# Patient Record
Sex: Female | Born: 1975 | Marital: Single | Smoking: Never smoker
Health system: Southern US, Community
[De-identification: ages and names within clinical notes are randomized; demographics above are authoritative.]

## PROBLEM LIST (undated history)

## (undated) DIAGNOSIS — E041 Nontoxic single thyroid nodule: Secondary | ICD-10-CM

## (undated) DIAGNOSIS — Z9889 Other specified postprocedural states: Secondary | ICD-10-CM

## (undated) DIAGNOSIS — F32A Depression, unspecified: Secondary | ICD-10-CM

## (undated) DIAGNOSIS — D649 Anemia, unspecified: Secondary | ICD-10-CM

## (undated) HISTORY — DX: Depression, unspecified: F32.A

## (undated) HISTORY — DX: Anemia, unspecified: D64.9

## (undated) HISTORY — DX: Other specified postprocedural states: Z98.890

## (undated) HISTORY — DX: Nontoxic single thyroid nodule: E04.1

---

## 2019-01-25 HISTORY — PX: COLONOSCOPY: SHX174

## 2020-10-01 ENCOUNTER — Encounter: Payer: Self-pay | Admitting: Hematology and Oncology

## 2020-10-20 ENCOUNTER — Other Ambulatory Visit: Payer: Self-pay

## 2020-10-20 ENCOUNTER — Encounter: Payer: Self-pay | Admitting: Oncology

## 2020-11-09 ENCOUNTER — Other Ambulatory Visit: Payer: Self-pay

## 2020-11-09 ENCOUNTER — Encounter: Payer: Self-pay | Admitting: Oncology

## 2020-11-09 ENCOUNTER — Encounter (INDEPENDENT_AMBULATORY_CARE_PROVIDER_SITE_OTHER): Payer: Self-pay

## 2020-11-09 ENCOUNTER — Inpatient Hospital Stay: Payer: Federal, State, Local not specified - PPO

## 2020-11-09 ENCOUNTER — Inpatient Hospital Stay: Payer: Federal, State, Local not specified - PPO | Attending: Oncology | Admitting: Oncology

## 2020-11-09 VITALS — BP 131/86 | HR 74 | Temp 99.6°F | Wt 190.0 lb

## 2020-11-09 DIAGNOSIS — N924 Excessive bleeding in the premenopausal period: Secondary | ICD-10-CM | POA: Diagnosis not present

## 2020-11-09 DIAGNOSIS — D649 Anemia, unspecified: Secondary | ICD-10-CM | POA: Diagnosis not present

## 2020-11-09 DIAGNOSIS — E042 Nontoxic multinodular goiter: Secondary | ICD-10-CM | POA: Insufficient documentation

## 2020-11-09 DIAGNOSIS — Z862 Personal history of diseases of the blood and blood-forming organs and certain disorders involving the immune mechanism: Secondary | ICD-10-CM | POA: Insufficient documentation

## 2020-11-09 DIAGNOSIS — R0602 Shortness of breath: Secondary | ICD-10-CM | POA: Insufficient documentation

## 2020-11-09 DIAGNOSIS — D72819 Decreased white blood cell count, unspecified: Secondary | ICD-10-CM | POA: Diagnosis not present

## 2020-11-09 DIAGNOSIS — Z8616 Personal history of COVID-19: Secondary | ICD-10-CM | POA: Diagnosis not present

## 2020-11-09 LAB — FERRITIN: Ferritin: 383 ng/mL — ABNORMAL HIGH (ref 11–307)

## 2020-11-09 LAB — CBC WITH DIFFERENTIAL/PLATELET
Abs Immature Granulocytes: 0 10*3/uL (ref 0.00–0.07)
Basophils Absolute: 0 10*3/uL (ref 0.0–0.1)
Basophils Relative: 1 %
Eosinophils Absolute: 0 10*3/uL (ref 0.0–0.5)
Eosinophils Relative: 1 %
HCT: 41.4 % (ref 36.0–46.0)
Hemoglobin: 14 g/dL (ref 12.0–15.0)
Immature Granulocytes: 0 %
Lymphocytes Relative: 34 %
Lymphs Abs: 1.1 10*3/uL (ref 0.7–4.0)
MCH: 33.3 pg (ref 26.0–34.0)
MCHC: 33.8 g/dL (ref 30.0–36.0)
MCV: 98.6 fL (ref 80.0–100.0)
Monocytes Absolute: 0.3 10*3/uL (ref 0.1–1.0)
Monocytes Relative: 10 %
Neutro Abs: 1.7 10*3/uL (ref 1.7–7.7)
Neutrophils Relative %: 54 %
Platelets: 271 10*3/uL (ref 150–400)
RBC: 4.2 MIL/uL (ref 3.87–5.11)
RDW: 14.1 % (ref 11.5–15.5)
WBC: 3.2 10*3/uL — ABNORMAL LOW (ref 4.0–10.5)
nRBC: 0 % (ref 0.0–0.2)

## 2020-11-09 LAB — RETIC PANEL
Immature Retic Fract: 11.2 % (ref 2.3–15.9)
RBC.: 4.13 MIL/uL (ref 3.87–5.11)
Retic Count, Absolute: 69.8 10*3/uL (ref 19.0–186.0)
Retic Ct Pct: 1.7 % (ref 0.4–3.1)
Reticulocyte Hemoglobin: 35.9 pg (ref >27.9)

## 2020-11-09 LAB — COMPREHENSIVE METABOLIC PANEL
ALT: 15 U/L (ref 0–44)
AST: 18 U/L (ref 15–41)
Albumin: 4.8 g/dL (ref 3.5–5.0)
Alkaline Phosphatase: 70 U/L (ref 38–126)
Anion gap: 7 (ref 5–15)
BUN: 12 mg/dL (ref 6–20)
CO2: 25 mmol/L (ref 22–32)
Calcium: 9.7 mg/dL (ref 8.9–10.3)
Chloride: 103 mmol/L (ref 98–111)
Creatinine, Ser: 0.57 mg/dL (ref 0.44–1.00)
GFR, Estimated: >60 mL/min (ref >60)
Glucose, Bld: 93 mg/dL (ref 70–99)
Potassium: 3.9 mmol/L (ref 3.5–5.1)
Sodium: 135 mmol/L (ref 135–145)
Total Bilirubin: 0.2 mg/dL — ABNORMAL LOW (ref 0.3–1.2)
Total Protein: 8.8 g/dL — ABNORMAL HIGH (ref 6.5–8.1)

## 2020-11-09 LAB — FOLATE: Folate: 34 ng/mL (ref >5.9)

## 2020-11-09 LAB — IRON AND TIBC
Iron: 111 ug/dL (ref 28–170)
Saturation Ratios: 37 % — ABNORMAL HIGH (ref 10.4–31.8)
TIBC: 298 ug/dL (ref 250–450)
UIBC: 187 ug/dL

## 2020-11-09 LAB — TECHNOLOGIST SMEAR REVIEW: Plt Morphology: NORMAL

## 2020-11-09 LAB — PREGNANCY, URINE: Preg Test, Ur: NEGATIVE

## 2020-11-09 LAB — VITAMIN B12: Vitamin B-12: 637 pg/mL (ref 180–914)

## 2020-11-09 LAB — LACTATE DEHYDROGENASE: LDH: 124 U/L (ref 98–192)

## 2020-11-09 NOTE — Progress Notes (Signed)
Hematology/Oncology Consult note [facility] [phone] [phone]   Patient Care Team: Pcp, No as PCP - General  REFERRING PROVIDER: No ref. provider found  CHIEF COMPLAINTS/REASON FOR VISIT:  Evaluation of anemia  HISTORY OF PRESENTING ILLNESS:   Cynthia Barajas is a  45 y.o.  female with PMH listed below was seen in consultation at the request of  No ref. provider found  for evaluation of anemia  Patient moved to [place] recently from [place]. She reports a history of iron deficiency anemia.  She previously has received IV iron treatments.  She has relocated multiple times due to her work nature. Per patient, she started her IV Injectafer back in 2018, in [place]. She reports a history of allergic reactions including difficult breathing and skin itching after receiving INFeD in 2019. Previous hematology note by [facility] Dr. Rennie Natter on 09/09/2020 was scanned to EMR.  Records were reviewed.  Patient received IV 25 mg of Benadryl prior to injectafer.  She has had nausea vomiting for 1 week after the treatments. Patient reports that it was her previous hematologist protocol that everybody receives management and she has asked to receive a lower dose of Benadryl. Iron deficiency was felt to be secondary to menorrhagia.  She follows up with the [place] [facility] and prefers to continue following up there. Patient reports that she had EGD/colonoscopy done in 2021.   She also reports having COVID19 Last year and continues to have chronic shortness of breath with exertion.  She is interested in finding a local pulmonologist for evaluation.  She has not had a local primary care provider yet.  Thyroid nodule, 09/04/2020, thyroid ultrasound showed stable thyroid nodules since 03/11/2020.  Was recommended to follow-up in 1, 3, 5 years.    Review of Systems  Constitutional:  Positive for fatigue. Negative for appetite change,  chills, fever and unexpected weight change.  HENT:   Negative for hearing loss and voice change.   Eyes:  Negative for eye problems.  Respiratory:  Negative for chest tightness and cough.   Cardiovascular:  Negative for chest pain.  Gastrointestinal:  Negative for abdominal distention, abdominal pain and blood in stool.  Endocrine: Negative for hot flashes.  Genitourinary:  Positive for menstrual problem. Negative for difficulty urinating and frequency.   Musculoskeletal:  Negative for arthralgias.  Skin:  Negative for itching and rash.  Neurological:  Negative for extremity weakness.  Hematological:  Negative for adenopathy.  Psychiatric/Behavioral:  Negative for confusion.    MEDICAL HISTORY:  Past Medical History:  Diagnosis Date   Anemia    Depression    Hx of colonoscopy    Thyroid nodule     SURGICAL HISTORY: Past Surgical History:  Procedure Laterality Date   COLONOSCOPY  2021    SOCIAL HISTORY: Social History   Socioeconomic History   Marital status: Single    Spouse name: Not on file   Number of children: Not on file   Years of education: Not on file   Highest education level: Not on file  Occupational History   Not on file  Tobacco Use   Smoking status: Never   Smokeless tobacco: Never  Vaping Use   Vaping Use: Never used  Substance and Sexual Activity   Alcohol use: Never   Drug use: Never   Sexual activity: Not on file  Other Topics Concern   Not on file  Social History Narrative   Not on file   Social Determinants of  Health   Financial Resource Strain: Not on file  Food Insecurity: Not on file  Transportation Needs: Not on file  Physical Activity: Not on file  Stress: Not on file  Social Connections: Not on file  Intimate Partner Violence: Not on file    FAMILY HISTORY: Family History  Problem Relation Age of Onset   Hypertension Mother     ALLERGIES:  has No Known Allergies.  MEDICATIONS:  Current Outpatient Medications   Medication Sig Dispense Refill   valACYclovir (VALTREX) 500 MG tablet Take 500 mg by mouth daily. SMARTSIG:1 Tablet(s) By Mouth Daily     No current facility-administered medications for this visit.     PHYSICAL EXAMINATION: ECOG PERFORMANCE STATUS: 1 - Symptomatic but completely ambulatory Vitals:   11/09/20 0916  BP: 131/86  Pulse: 74  Temp: 99.6 F (37.6 C)  SpO2: 99%   Filed Weights   11/09/20 0916  Weight: 190 lb (86.2 kg)    Physical Exam Constitutional:      General: She is not in acute distress. HENT:     Head: Normocephalic and atraumatic.  Eyes:     General: No scleral icterus. Cardiovascular:     Rate and Rhythm: Normal rate and regular rhythm.     Heart sounds: Normal heart sounds.  Pulmonary:     Effort: Pulmonary effort is normal. No respiratory distress.     Breath sounds: No wheezing.  Abdominal:     General: Bowel sounds are normal. There is no distension.     Palpations: Abdomen is soft.  Musculoskeletal:        General: No deformity. Normal range of motion.     Cervical back: Normal range of motion and neck supple.  Skin:    General: Skin is warm and dry.     Findings: No erythema or rash.  Neurological:     Mental Status: She is alert and oriented to person, place, and time. Mental status is at baseline.     Cranial Nerves: No cranial nerve deficit.     Coordination: Coordination normal.  Psychiatric:        Mood and Affect: Mood normal.    LABORATORY DATA:  I have reviewed the data as listed Lab Results  Component Value Date   WBC 3.2 (L) 11/09/2020   HGB 14.0 11/09/2020   HCT 41.4 11/09/2020   MCV 98.6 11/09/2020   PLT 271 11/09/2020   Recent Labs    11/09/20 1013  NA 135  K 3.9  CL 103  CO2 25  GLUCOSE 93  BUN 12  CREATININE 0.57  CALCIUM 9.7  GFRNONAA >60  PROT 8.8*  ALBUMIN 4.8  AST 18  ALT 15  ALKPHOS 70  BILITOT 0.2*   Iron/TIBC/Ferritin/ %Sat    Component Value Date/Time   IRON 111 11/09/2020 1013    TIBC 298 11/09/2020 1013   FERRITIN 383 (H) 11/09/2020 1013   IRONPCTSAT 37 (H) 11/09/2020 1013      RADIOGRAPHIC STUDIES: I have personally reviewed the radiological images as listed and agreed with the findings in the report. No results found.    ASSESSMENT & PLAN:  1. Anemia, unspecified type   2. Excessive bleeding in premenopausal period   3. Shortness of breath   4. Multiple thyroid nodules    Chronic anemia, history of iron deficiency anemia.  Menorrhagia I will check CBC, CMP, iron, TIBC ferritin, smear. We discussed about IV Venofer. Her reported history, she had a mild version of allergic reaction  to IV INFeD.  We discussed about 1 dose of Benadryl prior to first dose of IV Venofer.  If no reaction, will try additional IV Venofer treatments without Benadry;. Patient agrees with the plan.  We also discussed about side effects of Benadryl. she was advised not to drive immediately after receiving medication.  Leukopenia, check B12, folate, Shortness of breath, chronic symptoms after her previous COVID-19 infection.  I referred her to pulmonology.  #Thyroid nodules.  Patient will need follow-up ultrasounds.  I will defer to her primary care provider to obtain.  Patient to establish care with primary care provider.  A list of primary providers office information was presented to patient.  Orders Placed This Encounter  Procedures   Iron and TIBC    Standing Status:   Future    Number of Occurrences:   1    Standing Expiration Date:   11/09/2021   Ferritin    Standing Status:   Future    Number of Occurrences:   1    Standing Expiration Date:   05/10/2021   Vitamin B12    Standing Status:   Future    Number of Occurrences:   1    Standing Expiration Date:   11/09/2021   Folate    Standing Status:   Future    Number of Occurrences:   1    Standing Expiration Date:   11/09/2021   CBC with Differential/Platelet    Standing Status:   Future    Number of Occurrences:    1    Standing Expiration Date:   11/09/2021   Comprehensive metabolic panel    Standing Status:   Future    Number of Occurrences:   1    Standing Expiration Date:   11/09/2021   Retic Panel    Standing Status:   Future    Number of Occurrences:   1    Standing Expiration Date:   11/09/2021   Technologist smear review    Standing Status:   Future    Number of Occurrences:   1    Standing Expiration Date:   11/09/2021   Lactate dehydrogenase    Standing Status:   Future    Number of Occurrences:   1    Standing Expiration Date:   11/09/2021   Pregnancy, urine    Standing Status:   Future    Number of Occurrences:   1    Standing Expiration Date:   11/09/2021   Ambulatory referral to Pulmonology    Referral Priority:   Routine    Referral Type:   Consultation    Referral Reason:   Specialty Services Required    Referred to Provider:   Salena Saner, MD    Requested Specialty:   Pulmonary Disease    Number of Visits Requested:   1    All questions were answered. The patient knows to call the clinic with any problems questions or concerns.   Return of visit: 6 months Thank you for this kind referral and the opportunity to participate in the care of this patient. A copy of today's note is routed to referring provider    Rickard Patience, MD, PhD Hematology Oncology [facility]  11/09/2020

## 2020-11-10 ENCOUNTER — Telehealth: Payer: Self-pay

## 2020-11-10 NOTE — Telephone Encounter (Signed)
Pt informed of plan.   Please schedule labs in 6 months, then MD 1-2 days after labs and inform pt of appts.

## 2020-11-10 NOTE — Telephone Encounter (Signed)
-----   Message from Rickard Patience, MD sent at 11/09/2020  5:05 PM EDT ----- There wasLet her know that her iron levels are adequate and there is no anemia currently.  No need for IV Venofer treatments.  I recommend patient to follow-up in 6 months.  Labs prior.  Ordered.

## 2020-11-13 ENCOUNTER — Telehealth: Payer: Self-pay | Admitting: Oncology

## 2020-11-13 ENCOUNTER — Encounter: Payer: Self-pay | Admitting: Oncology

## 2020-11-13 NOTE — Progress Notes (Signed)
Received voicemail from patient regarding [facility] for [facility].Advised  I would send message to team requesting them to return call to her. Advised her to feel free to contact me for follow up if she does not receive a return call.  Secure chat sent to Janet Berlin w/[facility].  Patient has my name and contact number for any additional questions or concerns.

## 2020-11-18 NOTE — Telephone Encounter (Signed)
Forward to team 

## 2020-11-30 ENCOUNTER — Other Ambulatory Visit: Payer: Self-pay

## 2020-11-30 ENCOUNTER — Ambulatory Visit (INDEPENDENT_AMBULATORY_CARE_PROVIDER_SITE_OTHER): Payer: Federal, State, Local not specified - PPO | Admitting: Internal Medicine

## 2020-11-30 ENCOUNTER — Other Ambulatory Visit
Admission: RE | Admit: 2020-11-30 | Discharge: 2020-11-30 | Disposition: A | Discharge status: Home / Self Care | Payer: Federal, State, Local not specified - PPO | Attending: Internal Medicine | Admitting: Internal Medicine

## 2020-11-30 ENCOUNTER — Encounter: Payer: Self-pay | Admitting: Internal Medicine

## 2020-11-30 DIAGNOSIS — R0609 Other forms of dyspnea: Secondary | ICD-10-CM | POA: Diagnosis present

## 2020-11-30 LAB — TSH: TSH: 1.147 u[IU]/mL (ref 0.350–4.500)

## 2020-11-30 LAB — D-DIMER, QUANTITATIVE: D-Dimer, Quant: 0.37 ug/mL-FEU (ref 0.00–0.50)

## 2020-11-30 LAB — SEDIMENTATION RATE: Sed Rate: 14 mm/hr (ref 0–20)

## 2020-11-30 LAB — BRAIN NATRIURETIC PEPTIDE: B Natriuretic Peptide: 73.6 pg/mL (ref 0.0–100.0)

## 2020-11-30 NOTE — Progress Notes (Signed)
Cynthia Barajas, female    DOB: 1975-08-14,    MRN: [id number]   Brief patient profile:  83 yobf  from [place] never smoker no aerobics but very active with only resp problem = doe @ 155 lb 12/2016 attributed to fe def and occ used inhalersreferred to pulmonary clinic in [facility]  11/30/2020 by Dr Julienne Kass  for doe x 2018.       May 2020 covid while in [place], [place] and then  doe bad to worse and never better but cough resolved with ? Abn cxr reported in [place]    History of Present Illness  11/30/2020  Pulmonary/ 1st office eval/ Ronee Ranganathan / [employer] Complaint  Patient presents with   Consult    SOB- Covid in 2020 pt states she had pneumonia and has had difficulties breathing since   Dyspnea:  main problem  = steps,  no problem nl pace flat surface  Cough: none  Sleep: does fine flat bed 3 pillows  SABA use: never prechallenges   No obvious day to day or daytime variability or assoc excess/ purulent sputum or mucus plugs or hemoptysis or cp or chest tightness, subjective wheeze or overt sinus or hb symptoms.   Sleeping  without nocturnal  or early am exacerbation  of respiratory  c/o's or need for noct saba. Also denies any obvious fluctuation of symptoms with weather or environmental changes or other aggravating or alleviating factors except as outlined above   No unusual exposure hx or h/o childhood pna/ asthma or knowledge of premature birth.  Current Allergies, Complete Past Medical History, Past Surgical History, Family History, and Social History were reviewed in [employer] record.  ROS  The following are not active complaints unless bolded Hoarseness, sore throat, dysphagia, dental problems, itching, sneezing,  nasal congestion or discharge of excess mucus or purulent secretions, ear ache,   fever, chills, sweats, unintended wt loss or wt gain, classically pleuritic or exertional cp,  orthopnea pnd or arm/hand swelling  or leg  swelling, presyncope, palpitations, abdominal pain, anorexia, nausea, vomiting, diarrhea  or change in bowel habits or change in bladder habits, change in stools or change in urine, dysuria, hematuria,  rash, arthralgias, visual complaints, headache, numbness, weakness or ataxia or problems with walking or coordination,  change in mood or  memory.           Past Medical History:  Diagnosis Date   Anemia    Depression    Hx of colonoscopy    Thyroid nodule     Outpatient Medications Prior to Visit  Medication Sig Dispense Refill   Ascorbic Acid (VITAMIN C) 100 MG tablet Take 100 mg by mouth daily.     Multiple Vitamins-Minerals (RA CENTRAL-VITE WOMENS MATURE PO) Take by mouth.     Omega-3 1000 MG CAPS Take by mouth.     valACYclovir (VALTREX) 500 MG tablet Take 500 mg by mouth daily. SMARTSIG:1 Tablet(s) By Mouth Daily     vitamin B-12 (CYANOCOBALAMIN) 100 MCG tablet Take 100 mcg by mouth daily.     vitamin E 1000 UNIT capsule Take by mouth daily.     Zinc Sulfate (ZINC 15 PO) Take by mouth.     No facility-administered medications prior to visit.     Objective:     BP 110/80 (BP Location: Left Arm, Patient Position: Sitting, Cuff Size: Normal)   Pulse 69   Temp (!) 97.5 F (36.4 C) (Oral)   Ht 5\' 5"  (  1.651 m)   Wt 193 lb (87.5 kg)   SpO2 98%   BMI 32.12 kg/m   SpO2: 98 %  Pleasant mildly obese amb bf nad   HEENT : pt wearing mask not removed for exam due to covid -19 concerns.    NECK :  without JVD/Nodes/TM/ nl carotid upstrokes bilaterally   LUNGS: no acc muscle use,  Nl contour chest which is clear to A and P bilaterally without cough on insp or exp maneuvers   CV:  RRR  no s3 or murmur or increase in P2, and no edema   ABD:  soft and nontender with nl inspiratory excursion in the supine position. No bruits or organomegaly appreciated, bowel sounds nl  MS:  Nl gait/ ext warm without deformities, calf tenderness, cyanosis or clubbing No obvious joint  restrictions   SKIN: warm and dry without lesions    NEURO:  alert, approp, nl sensorium with  no motor or cerebellar deficits apparent.   CXR PA and Lateral:   11/30/2020 :    I personally reviewed images and agree with radiology impression as follows:    Did not go for cxr as rec   Labs ordered/ reviewed:      Chemistry      Component Value Date/Time   NA 135 11/09/2020 1013   K 3.9 11/09/2020 1013   CL 103 11/09/2020 1013   CO2 25 11/09/2020 1013   BUN 12 11/09/2020 1013   CREATININE 0.57 11/09/2020 1013      Component Value Date/Time   CALCIUM 9.7 11/09/2020 1013   ALKPHOS 70 11/09/2020 1013   AST 18 11/09/2020 1013   ALT 15 11/09/2020 1013   BILITOT 0.2 (L) 11/09/2020 1013        Lab Results  Component Value Date   WBC 3.2 (L) 11/09/2020   HGB 14.0 11/09/2020   HCT 41.4 11/09/2020   MCV 98.6 11/09/2020   PLT 271 11/09/2020      EOS                                                               0                                       11/30/2020   Lab Results  Component Value Date   DDIMER 0.37 11/30/2020      Lab Results  Component Value Date   TSH 1.147 11/30/2020      BNP  11/30/2020   = 74      Lab Results  Component Value Date   ESRSEDRATE 14 11/30/2020          Assessment   DOE (dyspnea on exertion) Onset 2018 @ baseline wt 155 - bad to worse doe since covid 2020  - 11/30/2020   Walked on RA x  3  lap(s) =  approx  525 ft @ very pace, stopped due to end of study, no sob  with lowest 02 sats 99%   Symptoms are markedly disproportionate to objective findings and not clear to what extent this is actually a pulmonary  problem but pt does appear to have difficult to sort out respiratory symptoms  of unknown origin for which  DDX  = almost all start with A and  include Adherence, Ace Inhibitors, Acid Reflux, Active Sinus Disease, Alpha 1 Antitripsin deficiency, Anxiety masquerading as Airways dz,  ABPA,  Allergy(esp in young), Aspiration (esp in  elderly), Adverse effects of meds,  Active smoking or Vaping, A bunch of PE's/clot burden (a few small clots can't cause this syndrome unless there is already severe underlying pulm or vascular dz with poor reserve),  Anemia or thyroid disorder, plus two Bs  = Bronchiectasis and Beta blocker use..and one C= CHF     Adherence is always the initial "prime suspect" and is a multilayered concern that requires a "trust but verify" approach in every patient - starting with knowing how to use medications, especially inhalers, correctly, keeping up with refills and understanding the fundamental difference between maintenance and prns vs those medications only taken for a very short course and then stopped and not refilled.   ? Anemia/ thryoid dz both ruled out today   ? Allergy/ asthma >   Eos= 0 / / sent IgE but absence of noct or nasal symptoms rules against so just use saba prn no relief at rest or pre-challenge before ex  - - The proper method of use, as well as anticipated side effects, of a metered-dose inhaler were discussed and demonstrated to the patient using teach back method.  Re saba Re SABA :  I spent extra time with pt today reviewing appropriate use of albuterol for prn use on exertion with the following points: 1) saba is for relief of sob that does not improve by walking a slower pace or resting but rather if the pt does not improve after trying this first. 2) If the pt is convinced, as many are, that saba helps recover from activity faster then it's easy to tell if this is the case by re-challenging : ie stop, take the inhaler, then p 5 minutes try the exact same activity (intensity of workload) that just caused the symptoms and see if they are substantially diminished or not after saba 3) if there is an activity that reproducibly causes the symptoms, try the saba 15 min before the activity on alternate days   If in fact the saba really does help, then fine to continue to use it prn but  advised may need to look closer at the maintenance regimen being used to achieve better control of airways disease with exertion.    ? Anxiety/depression/ deconditioning > usually at the bottom of this list of usual suspects but   may interfere with adherence and also interpretation of response or lack thereof to symptom management which can be quite subjective.  ? Adverse side effects of meds > none of the usual suspects listed   ? Chf> BNP 74 usually rules this out    >>> rec reconditioning and f/u in 6 weeks with cxr on return if not done in meantime      Each maintenance medication was reviewed in detail including emphasizing most importantly the difference between maintenance and prns and under what circumstances the prns are to be triggered using an action plan format where appropriate.  Total time for H and P, chart review, counseling, reviewing hfa device(s) , directly observing portions of ambulatory 02 saturation study/ and generating customized AVS unique to this new pat office visit / same day charting  > 45 min  Sandrea Hughs, MD 11/30/2020

## 2020-11-30 NOTE — Patient Instructions (Signed)
To get the most out of exercise, you need to be continuously aware that you are short of breath, but never out of breath, for at least 30 minutes daily. As you improve, it will actually be easier for you to do the same amount of exercise  in  30 minutes so always push to the level where you are short of breath.     Make sure you check your oxygen saturations at highest level of activity   Ok to try albuterol 15 min before an activity (on alternating days)  that you know would usually make you short of breath and see if it makes any difference and if makes none then don't take albuterol after activity unless you can't catch your breath as this means it's the resting that helps, not the albuterol.      Please remember to go to the lab and x-ray department  for your tests - we will call you with the results when they are available.       Please schedule a follow up office visit in 6 weeks, call sooner if needed

## 2020-11-30 NOTE — Assessment & Plan Note (Addendum)
Onset 2018 @ baseline wt 155 - bad to worse doe since covid 2020  - 11/30/2020   Walked on RA x  3  lap(s) =  approx  525 ft @ very pace, stopped due to end of study, no sob  with lowest 02 sats 99%   Symptoms are markedly disproportionate to objective findings and not clear to what extent this is actually a pulmonary  problem but pt does appear to have difficult to sort out respiratory symptoms of unknown origin for which  DDX  = almost all start with A and  include Adherence, Ace Inhibitors, Acid Reflux, Active Sinus Disease, Alpha 1 Antitripsin deficiency, Anxiety masquerading as Airways dz,  ABPA,  Allergy(esp in young), Aspiration (esp in elderly), Adverse effects of meds,  Active smoking or Vaping, A bunch of PE's/clot burden (a few small clots can't cause this syndrome unless there is already severe underlying pulm or vascular dz with poor reserve),  Anemia or thyroid disorder, plus two Bs  = Bronchiectasis and Beta blocker use..and one C= CHF     Adherence is always the initial "prime suspect" and is a multilayered concern that requires a "trust but verify" approach in every patient - starting with knowing how to use medications, especially inhalers, correctly, keeping up with refills and understanding the fundamental difference between maintenance and prns vs those medications only taken for a very short course and then stopped and not refilled.   ? Anemia/ thryoid dz both ruled out today   ? Allergy/ asthma >   Eos= 0 / / sent IgE but absence of noct or nasal symptoms rules against so just use saba prn no relief at rest or pre-challenge before ex  - - The proper method of use, as well as anticipated side effects, of a metered-dose inhaler were discussed and demonstrated to the patient using teach back method.  Re saba Re SABA :  I spent extra time with pt today reviewing appropriate use of albuterol for prn use on exertion with the following points: 1) saba is for relief of sob that does not  improve by walking a slower pace or resting but rather if the pt does not improve after trying this first. 2) If the pt is convinced, as many are, that saba helps recover from activity faster then it's easy to tell if this is the case by re-challenging : ie stop, take the inhaler, then p 5 minutes try the exact same activity (intensity of workload) that just caused the symptoms and see if they are substantially diminished or not after saba 3) if there is an activity that reproducibly causes the symptoms, try the saba 15 min before the activity on alternate days   If in fact the saba really does help, then fine to continue to use it prn but advised may need to look closer at the maintenance regimen being used to achieve better control of airways disease with exertion.    ? Anxiety/depression/ deconditioning > usually at the bottom of this list of usual suspects but   may interfere with adherence and also interpretation of response or lack thereof to symptom management which can be quite subjective.  ? Adverse side effects of meds > none of the usual suspects listed   ? Chf> BNP 74 usually rules this out    >>> rec reconditioning and f/u in 6 weeks with cxr on return if not done in meantime      Each maintenance medication was reviewed in  detail including emphasizing most importantly the difference between maintenance and prns and under what circumstances the prns are to be triggered using an action plan format where appropriate.  Total time for H and P, chart review, counseling, reviewing hfa device(s) , directly observing portions of ambulatory 02 saturation study/ and generating customized AVS unique to this new pat office visit / same day charting  > 45 min

## 2020-12-01 ENCOUNTER — Encounter: Payer: Self-pay | Admitting: Internal Medicine

## 2020-12-06 LAB — IGE: IgE (Immunoglobulin E), Serum: 5 IU/mL — ABNORMAL LOW (ref 6–495)

## 2020-12-28 ENCOUNTER — Encounter: Payer: Self-pay | Admitting: Internal Medicine

## 2020-12-28 ENCOUNTER — Ambulatory Visit: Payer: Federal, State, Local not specified - PPO | Admitting: Internal Medicine

## 2020-12-28 ENCOUNTER — Ambulatory Visit
Admission: RE | Admit: 2020-12-28 | Discharge: 2020-12-28 | Disposition: A | Discharge status: Home / Self Care | Payer: Federal, State, Local not specified - PPO | Source: Ambulatory Visit | Attending: Internal Medicine | Admitting: Internal Medicine

## 2020-12-28 ENCOUNTER — Other Ambulatory Visit: Payer: Self-pay

## 2020-12-28 DIAGNOSIS — R0609 Other forms of dyspnea: Secondary | ICD-10-CM | POA: Insufficient documentation

## 2020-12-28 NOTE — Patient Instructions (Signed)
To get the most out of exercise, you need to be continuously aware that you are short of breath, but never out of breath, for at least 30 minutes daily. As you improve, it will actually be easier for you to do the same amount of exercise  in  30 minutes so always push to the level where you are short of breath.     Make sure you check your oxygen saturations at highest level of activity  Please remember to go to the  x-ray department  for your tests - we will call you with the results when they are available    Pulmonary follow up is as needed

## 2020-12-28 NOTE — Progress Notes (Signed)
Cynthia Barajas, female    DOB: [birth year]    MRN: [id number]   Brief patient profile:  31 yobf  from [place] ([place]) never smoker no aerobics but very active with only resp problem = doe @ 155 lb 12/2016 attributed to fe def and occ used inhalers ? Response referred to pulmonary Barajas in [facility]  11/30/2020 by Cynthia Barajas  for doe x 2018.       May 2020 covid while in [place], [place] and then  doe bad to worse and never better but cough resolved with ? Abn cxr reported in [place]    History of Present Illness  11/30/2020  Pulmonary/ 1st office eval/ Cynthia Barajas / Cynthia Barajas Complaint  Patient presents with   Consult    SOB- Covid in 2020 pt states she had pneumonia and has had difficulties breathing since   Dyspnea:  main problem  = steps,  no problem nl pace flat surface  Cough: none  Sleep: does fine flat bed 3 pillows  SABA use: never prechallenges  Rec To get the most out of exercise, you need to be continuously aware that you are short of breath, but never out of breath, for at least 30 minutes daily  Make sure you check your oxygen saturations at highest level of activity  Ok to try albuterol 15 min before an activity (on alternating days)  that you know would usually make you short of breath   Labs:  all wnl    12/28/2020  f/u ov/Cynthia Barajas/ Cynthia Barajas re: doe  maint on no rx  Chief Complaint  Patient presents with   Follow-up  Dyspnea:  walking 3 days x 20 x neighborhood some [place] never sob when exercising, just occ when gets to top of 2 flights of steps at work or goes in freezer (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    Sleeping  without nocturnal  or early am exacerbation  of respiratory  c/o's or need for noct saba. Also denies any obvious fluctuation of symptoms with weather or environmental changes or other aggravating or alleviating factors except as outlined above   No unusual exposure hx or h/o childhood pna/ asthma or knowledge of premature birth.  Current Allergies, Complete Past Medical History, Past Surgical History, Family History, and Social History were reviewed in [employer] record.  ROS  The following are not active complaints unless bolded Hoarseness, sore throat, dysphagia, dental problems, itching, sneezing,  nasal congestion or discharge of excess mucus or purulent secretions, ear ache,   fever, chills, sweats, unintended wt loss or wt gain, classically pleuritic or exertional cp,  orthopnea pnd or arm/hand swelling  or leg swelling, presyncope, palpitations, abdominal pain, anorexia, nausea, vomiting, diarrhea  or change in bowel habits or change in bladder habits, change in stools or change in urine, dysuria, hematuria,  rash, arthralgias, visual complaints, headache, numbness, weakness or ataxia or problems with walking or coordination,  change in mood or  memory.        Current Meds  Medication Sig   Ascorbic Acid (VITAMIN C) 100  MG tablet Take 100 mg by mouth daily.   Multiple Vitamins-Minerals (RA CENTRAL-VITE WOMENS MATURE PO) Take by mouth.   Omega-3 1000 MG CAPS Take by mouth.   valACYclovir (VALTREX) 500 MG tablet Take 500 mg by mouth daily. SMARTSIG:1 Tablet(s) By Mouth Daily   vitamin B-12 (CYANOCOBALAMIN) 100 MCG tablet Take 100 mcg by mouth daily.   vitamin E 1000 UNIT capsule Take by mouth daily.   Zinc Sulfate (ZINC 15 PO) Take by mouth.             Objective:    Wt Readings from Last 3 Encounters:  12/28/20 192 lb (87.1 kg)  11/30/20 193 lb (87.5 kg)  11/09/20 190 lb (86.2 kg)      Vital signs reviewed  12/28/2020  - Note at rest 02  sats  97% on RA   General appearance:    amb bf nad   HEENT : pt wearing mask not removed for exam due to covid -19 concerns.    NECK :  without JVD/Nodes/TM/ nl carotid upstrokes bilaterally   LUNGS: no acc muscle use,  Nl contour chest which is clear to A and P bilaterally without cough on insp or exp maneuvers   CV:  RRR  no s3 or murmur or increase in P2, and no edema   ABD:  soft and nontender with nl inspiratory excursion in the supine position. No bruits or organomegaly appreciated, bowel sounds nl  MS:  Nl gait/ ext warm without deformities, calf tenderness, cyanosis or clubbing No obvious joint restrictions   SKIN: warm and dry without lesions    NEURO:  alert, approp, nl sensorium with  no motor or cerebellar deficits apparent.    CXR PA and Lateral:   12/28/2020 :    I personally reviewed images and  impression as follows:      No acute changes  - typical post covid appearance with minimal scarring              Assessment

## 2020-12-28 NOTE — Assessment & Plan Note (Signed)
Onset 2018 @ baseline wt 155 - bad to worse doe since covid 2020  - 11/30/2020   Walked on RA x  3  lap(s) =  approx  525 ft @ very pace, stopped due to end of study, no sob  with lowest 02 sats 99%  - Allergy profile 11/30/20  >  Eos 0.0 /  IgE  5  - 12/28/2020  After extensive coaching inhaler device,  effectiveness =    80%  Gradually improving c/w obesity deconditioning p covid with very little evidence to support asthma though we haven't ruled it out so rec:  Re SABA :  I spent extra time with pt today reviewing appropriate use of albuterol for prn use on exertion with the following points: 1) saba is for relief of sob that does not improve by walking a slower pace or resting but rather if the pt does not improve after trying this first. 2) If the pt is convinced, as many are, that saba helps recover from activity faster then it's easy to tell if this is the case by re-challenging : ie stop, take the inhaler, then p 5 minutes try the exact same activity (intensity of workload) that just caused the symptoms and see if they are substantially diminished or not after saba 3) if there is an activity that reproducibly causes the symptoms, try the saba 15 min before the activity on alternate days   If in fact the saba really does help, then fine to continue to use it prn but advised may need to look closer at the maintenance regimen being used to achieve better control of airways disease with exertion.           Each maintenance medication was reviewed in detail including emphasizing most importantly the difference between maintenance and prns and under what circumstances the prns are to be triggered using an action plan format where appropriate.  Total time for H and P, chart review, counseling, reviewing hfa  device(s) and generating customized AVS unique to this summary final office visit / same day charting = 30 min

## 2021-01-19 ENCOUNTER — Telehealth: Payer: Self-pay | Admitting: *Deleted

## 2021-01-19 NOTE — Telephone Encounter (Addendum)
Patient called reporting that she is having weakness and headaches which is how she feels when her iron is low, she is asking if she can come in sooner than for her appointment, her next appointment is not until April. Please advise

## 2021-01-20 NOTE — Telephone Encounter (Signed)
error 

## 2021-01-21 ENCOUNTER — Other Ambulatory Visit: Payer: Self-pay

## 2021-01-21 ENCOUNTER — Inpatient Hospital Stay: Payer: Federal, State, Local not specified - PPO | Attending: Oncology

## 2021-01-21 DIAGNOSIS — D649 Anemia, unspecified: Secondary | ICD-10-CM | POA: Diagnosis present

## 2021-01-21 DIAGNOSIS — N924 Excessive bleeding in the premenopausal period: Secondary | ICD-10-CM

## 2021-01-21 DIAGNOSIS — R0602 Shortness of breath: Secondary | ICD-10-CM

## 2021-01-21 DIAGNOSIS — E042 Nontoxic multinodular goiter: Secondary | ICD-10-CM | POA: Insufficient documentation

## 2021-01-21 LAB — CBC WITH DIFFERENTIAL/PLATELET
Abs Immature Granulocytes: 0 10*3/uL (ref 0.00–0.07)
Basophils Absolute: 0 10*3/uL (ref 0.0–0.1)
Basophils Relative: 1 %
Eosinophils Absolute: 0.1 10*3/uL (ref 0.0–0.5)
Eosinophils Relative: 1 %
HCT: 36 % (ref 36.0–46.0)
Hemoglobin: 12.3 g/dL (ref 12.0–15.0)
Immature Granulocytes: 0 %
Lymphocytes Relative: 23 %
Lymphs Abs: 0.9 10*3/uL (ref 0.7–4.0)
MCH: 33.4 pg (ref 26.0–34.0)
MCHC: 34.2 g/dL (ref 30.0–36.0)
MCV: 97.8 fL (ref 80.0–100.0)
Monocytes Absolute: 0.5 10*3/uL (ref 0.1–1.0)
Monocytes Relative: 12 %
Neutro Abs: 2.5 10*3/uL (ref 1.7–7.7)
Neutrophils Relative %: 63 %
Platelets: 281 10*3/uL (ref 150–400)
RBC: 3.68 MIL/uL — ABNORMAL LOW (ref 3.87–5.11)
RDW: 12.6 % (ref 11.5–15.5)
WBC: 4 10*3/uL (ref 4.0–10.5)
nRBC: 0 % (ref 0.0–0.2)

## 2021-01-21 LAB — IRON AND TIBC
Iron: 87 ug/dL (ref 28–170)
Saturation Ratios: 34 % — ABNORMAL HIGH (ref 10.4–31.8)
TIBC: 255 ug/dL (ref 250–450)
UIBC: 168 ug/dL

## 2021-01-21 LAB — COMPREHENSIVE METABOLIC PANEL
ALT: 16 U/L (ref 0–44)
AST: 19 U/L (ref 15–41)
Albumin: 4.1 g/dL (ref 3.5–5.0)
Alkaline Phosphatase: 60 U/L (ref 38–126)
Anion gap: 9 (ref 5–15)
BUN: 7 mg/dL (ref 6–20)
CO2: 28 mmol/L (ref 22–32)
Calcium: 9.3 mg/dL (ref 8.9–10.3)
Chloride: 101 mmol/L (ref 98–111)
Creatinine, Ser: 0.61 mg/dL (ref 0.44–1.00)
GFR, Estimated: >60 mL/min (ref >60)
Glucose, Bld: 87 mg/dL (ref 70–99)
Potassium: 3.3 mmol/L — ABNORMAL LOW (ref 3.5–5.1)
Sodium: 138 mmol/L (ref 135–145)
Total Bilirubin: 0.5 mg/dL (ref 0.3–1.2)
Total Protein: 7.6 g/dL (ref 6.5–8.1)

## 2021-01-21 LAB — TSH: TSH: 1.597 u[IU]/mL (ref 0.350–4.500)

## 2021-03-07 ENCOUNTER — Ambulatory Visit
Admission: EM | Admit: 2021-03-07 | Discharge: 2021-03-07 | Disposition: A | Discharge status: Home / Self Care | Payer: Federal, State, Local not specified - PPO | Attending: Emergency Medicine | Admitting: Emergency Medicine

## 2021-03-07 ENCOUNTER — Emergency Department
Admission: EM | Admit: 2021-03-07 | Discharge: 2021-03-07 | Disposition: A | Discharge status: Home / Self Care | Payer: Federal, State, Local not specified - PPO | Attending: Emergency Medicine | Admitting: Emergency Medicine

## 2021-03-07 ENCOUNTER — Other Ambulatory Visit: Payer: Self-pay

## 2021-03-07 ENCOUNTER — Emergency Department: Payer: Federal, State, Local not specified - PPO

## 2021-03-07 ENCOUNTER — Encounter: Payer: Self-pay | Admitting: Emergency Medicine

## 2021-03-07 DIAGNOSIS — R079 Chest pain, unspecified: Secondary | ICD-10-CM | POA: Insufficient documentation

## 2021-03-07 DIAGNOSIS — R9431 Abnormal electrocardiogram [ECG] [EKG]: Secondary | ICD-10-CM

## 2021-03-07 LAB — BASIC METABOLIC PANEL
Anion gap: 8 (ref 5–15)
BUN: 10 mg/dL (ref 6–20)
CO2: 25 mmol/L (ref 22–32)
Calcium: 9 mg/dL (ref 8.9–10.3)
Chloride: 105 mmol/L (ref 98–111)
Creatinine, Ser: 0.54 mg/dL (ref 0.44–1.00)
GFR, Estimated: >60 mL/min (ref >60)
Glucose, Bld: 104 mg/dL — ABNORMAL HIGH (ref 70–99)
Potassium: 4 mmol/L (ref 3.5–5.1)
Sodium: 138 mmol/L (ref 135–145)

## 2021-03-07 LAB — CBC
HCT: 36.7 % (ref 36.0–46.0)
Hemoglobin: 11.9 g/dL — ABNORMAL LOW (ref 12.0–15.0)
MCH: 32.6 pg (ref 26.0–34.0)
MCHC: 32.4 g/dL (ref 30.0–36.0)
MCV: 100.5 fL — ABNORMAL HIGH (ref 80.0–100.0)
Platelets: 251 10*3/uL (ref 150–400)
RBC: 3.65 MIL/uL — ABNORMAL LOW (ref 3.87–5.11)
RDW: 13.2 % (ref 11.5–15.5)
WBC: 4.1 10*3/uL (ref 4.0–10.5)
nRBC: 0 % (ref 0.0–0.2)

## 2021-03-07 LAB — TROPONIN I (HIGH SENSITIVITY): Troponin I (High Sensitivity): 2 ng/L (ref <18)

## 2021-03-07 NOTE — ED Provider Notes (Signed)
[facility] Provider Note    Event Date/Time   First MD Initiated Contact with Patient 03/07/21 [phone]     (approximate)   History   Chest Pain and Abnormal ECG   HPI  Melia Liebler is a 46 y.o. female with past medical history of depression and anemia who presents with chest pain.  Has been going on for several days.  Pain is located in the right upper chest and beneath the right breast, throbbing in quality.  Pain was worse when she was laying on her right side improved with moving to the left side.  Not worse with exertion or with inspiration.  Patient is convinced that this is due to stress.  She starts to feel the pain when she feels anxious.  Denies shortness of breath nausea or diaphoresis.The patient denies hx of prior DVT/PE, unilateral leg pain/swelling, hormone use, recent surgery, hx of cancer, prolonged immobilization, or hemoptysis.  She went to urgent care today and they told her to come to the emergency department Past Medical History:  Diagnosis Date   Anemia    Depression    Hx of colonoscopy    Thyroid nodule     Patient Active Problem List   Diagnosis Date Noted   DOE (dyspnea on exertion) 11/30/2020   Anemia 11/09/2020     Physical Exam  Triage Vital Signs: ED Triage Vitals  Enc Vitals Group     BP 03/07/21 0934 126/77     Pulse Rate 03/07/21 0934 84     Resp 03/07/21 0934 18     Temp 03/07/21 0934 97.9 F (36.6 C)     Temp Source 03/07/21 0934 Oral     SpO2 03/07/21 0934 95 %     Weight 03/07/21 0933 195 lb (88.5 kg)     Height 03/07/21 0933 5\' 5"  (1.651 m)     Head Circumference --      Peak Flow --      Pain Score 03/07/21 0933 5     Pain Loc --      Pain Edu? --      Excl. in [place]? --     Most recent vital signs: Vitals:   03/07/21 0934  BP: 126/77  Pulse: 84  Resp: 18  Temp: 97.9 F (36.6 C)  SpO2: 95%     General: Awake, no distress.  CV:  Good peripheral perfusion.  No lower extremity  edema Resp:  Normal effort.  Abd:  No distention.  Neuro:             Awake, Alert, Oriented x 3  Other:     ED Results / Procedures / Treatments  Labs (all labs ordered are listed, but only abnormal results are displayed) Labs Reviewed  BASIC METABOLIC PANEL - Abnormal; Notable for the following components:      Result Value   Glucose, Bld 104 (*)    All other components within normal limits  CBC - Abnormal; Notable for the following components:   RBC 3.65 (*)    Hemoglobin 11.9 (*)    MCV 100.5 (*)    All other components within normal limits  TROPONIN I (HIGH SENSITIVITY)  TROPONIN I (HIGH SENSITIVITY)     EKG  EKG interpreted myself, right axis deviation, low voltage, normal sinus rhythm, no acute ischemic changes   RADIOLOGY I reviewed the CXR which does not show any acute cardiopulmonary process; agree with radiology report     PROCEDURES:  Critical  Care performed: No    MEDICATIONS ORDERED IN ED: Medications - No data to display   IMPRESSION / MDM / [place] / ED COURSE  I reviewed the triage vital signs and the nursing notes.                              Differential diagnosis includes, but is not limited to, pulmonary embolism, GERD, MSK, anxiety, ACS less likely  This is a 46 year old female who presents with several days of chest pain.  It is atypical in quality and that it is intermittent it is not exertional without associated symptoms.  It is worse when she starts to feel anxious and worse with lying on the area of pain improves with moving onto the left side.  She was seen earlier at urgent care and they told her to come to the emergency department because of some abnormalities on her EKG.  I reviewed her EKG she has a right axis and somewhat low voltage but there were no acute ischemic changes.  Chest x-ray reviewed by myself has no acute cardiopulmonary process.  Patient is PERC negative so my concern for PE is low.  Her troponin is  negative.  Ultimately her symptomatology is not consistent with ischemic pain or unstable angina.  Suspect benign etiology.  She is appropriate for discharge      FINAL CLINICAL IMPRESSION(S) / ED DIAGNOSES   Final diagnoses:  Chest pain, unspecified type     Rx / DC Orders   ED Discharge Orders     None        Note:  This document was prepared using Dragon voice recognition software and may include unintentional dictation errors.   Rada Hay, MD 03/07/21 [phone]

## 2021-03-07 NOTE — ED Notes (Addendum)
Pt via POV from home. Pt states she has been having some R sided non-radiating CP that started on Friday, states it is intermittent. Denies NVD. Denies SOB. States that she has been under increased stress with work and family. Denies any cardiac hx. Pt is A&Ox4 and NAD.

## 2021-03-07 NOTE — Discharge Instructions (Addendum)
Go to the emergency department for evaluation of your chest pain and abnormal EKG.  ? ? ?

## 2021-03-07 NOTE — Discharge Instructions (Signed)
Your blood work including your cardiac enzymes were reassuring.  If you have ongoing pain that is worsening or you develop any new symptoms such as difficulty breathing, please return to the emergency department.

## 2021-03-07 NOTE — ED Provider Notes (Signed)
Renaldo Fiddler    CSN: [id number] Arrival date & time: 03/07/21  3536      History   Chief Complaint Chief Complaint  Patient presents with   Chest Pain    HPI Cynthia Barajas is a 46 y.o. female.  Patient presents with chest pain intermittently x2 days.  The pain is on the right side of her chest and under her right breast.  Nonradiating; no associated symptoms.  She reports 5 or 6 episodes of chest pain lasting about an hour each; no current chest pain; last episode occurred last night.  The chest pain starts when sitting or ambulating and improves with rest.  She denies focal weakness, dizziness, headache, shortness of breath, nausea, vomiting, or other symptoms.  She denies history of cardiac disease but her younger brother recently had a stroke.  She reports multiple life stressors currently.  Her medical history includes anemia, dyspnea on exertion, thyroid nodule, depression.  The history is provided by the patient and medical records.   Past Medical History:  Diagnosis Date   Anemia    Depression    Hx of colonoscopy    Thyroid nodule     Patient Active Problem List   Diagnosis Date Noted   DOE (dyspnea on exertion) 11/30/2020   Anemia 11/09/2020    Past Surgical History:  Procedure Laterality Date   COLONOSCOPY  2021    OB History   No obstetric history on file.      Home Medications    Prior to Admission medications   Medication Sig Start Date End Date Taking? Authorizing Provider  Ascorbic Acid (VITAMIN C) 100 MG tablet Take 100 mg by mouth daily.    [provider]  Multiple Vitamins-Minerals (RA CENTRAL-VITE WOMENS MATURE PO) Take by mouth.    [provider]  Omega-3 1000 MG CAPS Take by mouth.    [provider]  valACYclovir (VALTREX) 500 MG tablet Take 500 mg by mouth daily. SMARTSIG:1 Tablet(s) By Mouth Daily 09/17/20   [provider]  vitamin B-12 (CYANOCOBALAMIN) 100 MCG tablet Take 100 mcg by mouth  daily.    [provider]  vitamin E 1000 UNIT capsule Take by mouth daily.    [provider]  Zinc Sulfate (ZINC 15 PO) Take by mouth.    [provider]    Family History Family History  Problem Relation Age of Onset   Hypertension Mother     Social History Social History   Tobacco Use   Smoking status: Never   Smokeless tobacco: Never  Vaping Use   Vaping Use: Never used  Substance Use Topics   Alcohol use: Never   Drug use: Never     Allergies   Patient has no known allergies.   Review of Systems Review of Systems  Constitutional:  Negative for chills and fever.  Respiratory:  Negative for cough and shortness of breath.   Cardiovascular:  Positive for chest pain. Negative for palpitations and leg swelling.  Gastrointestinal:  Negative for nausea and vomiting.  Skin:  Negative for color change and rash.  Neurological:  Negative for dizziness, syncope, facial asymmetry, speech difficulty, weakness, numbness and headaches.  All other systems reviewed and are negative.   Physical Exam Triage Vital Signs ED Triage Vitals  Enc Vitals Group     BP      Pulse      Resp      Temp      Temp src  SpO2      Weight      Height      Head Circumference      Peak Flow      Pain Score      Pain Loc      Pain Edu?      Excl. in [place]?    No data found.  Updated Vital Signs BP 114/79    Pulse 84    Temp 99.6 F (37.6 C)    Resp (!) 22    LMP 02/28/2021 (Approximate)    SpO2 98%   Visual Acuity Right Eye Distance:   Left Eye Distance:   Bilateral Distance:    Right Eye Near:   Left Eye Near:    Bilateral Near:     Physical Exam Vitals and nursing note reviewed.  Constitutional:      General: She is not in acute distress.    Appearance: Normal appearance. She is well-developed. She is not ill-appearing.  HENT:     Mouth/Throat:     Mouth: Mucous membranes are moist.  Cardiovascular:     Rate and Rhythm: Normal rate and  regular rhythm.     Heart sounds: Normal heart sounds.  Pulmonary:     Effort: Pulmonary effort is normal. No respiratory distress.     Breath sounds: Normal breath sounds.  Musculoskeletal:     Cervical back: Neck supple.     Right lower leg: No edema.     Left lower leg: No edema.  Skin:    General: Skin is warm and dry.  Neurological:     General: No focal deficit present.     Mental Status: She is alert and oriented to person, place, and time.     Sensory: No sensory deficit.     Motor: No weakness.     Gait: Gait normal.  Psychiatric:        Mood and Affect: Mood normal.        Behavior: Behavior normal.     UC Treatments / Results  Labs (all labs ordered are listed, but only abnormal results are displayed) Labs Reviewed - No data to display  EKG   Radiology No results found.  Procedures Procedures (including critical care time)  Medications Ordered in UC Medications - No data to display  Initial Impression / Assessment and Plan / UC Course  I have reviewed the triage vital signs and the nursing notes.  Pertinent labs & imaging results that were available during my care of the patient were reviewed by me and considered in my medical decision making (see chart for details).   Chest pain, abnormal EKG.  EKG shows sinus rhythm, rate 81, inverted T waves in lead III and V2, no previous EKG to compare.  Patient is currently asymptomatic.  Based on her episodes of chest pain and her EKG (no previous to compare), sending her to the ED for evaluation.  She feels stable for transport to the ED via POV.   Final Clinical Impressions(s) / UC Diagnoses   Final diagnoses:  Chest pain, unspecified type  Abnormal EKG     Discharge Instructions      Go to the emergency department for evaluation of your chest pain and abnormal EKG.        ED Prescriptions   None    PDMP not reviewed this encounter.   Mickie Bail, NP 03/07/21 [phone]

## 2021-03-07 NOTE — ED Triage Notes (Signed)
Pt sent by UC for abnormal EKG. C/o chest pain since Friday due to a lot of new life stressors.

## 2021-03-07 NOTE — ED Triage Notes (Signed)
Pt requested to use restroom before obtaining EKG

## 2021-03-07 NOTE — ED Notes (Signed)
Patient transported to X-ray 

## 2021-03-07 NOTE — ED Triage Notes (Signed)
Pt here with right sided chest pain x 2 days. Pt reports tightness, sharpness and pressure. Pt has had recent stressors and contributes most of the pain to stress and anxiety, but wants to be sure since the pain is persistent. Pain is worse when she lays on that side.

## 2021-03-07 NOTE — ED Notes (Signed)
Dr. Sidney Ace at bedside at this time

## 2021-05-12 ENCOUNTER — Other Ambulatory Visit: Payer: Self-pay

## 2021-05-12 ENCOUNTER — Inpatient Hospital Stay: Payer: Federal, State, Local not specified - PPO | Attending: Oncology

## 2021-05-12 DIAGNOSIS — D649 Anemia, unspecified: Secondary | ICD-10-CM

## 2021-05-12 DIAGNOSIS — D708 Other neutropenia: Secondary | ICD-10-CM | POA: Insufficient documentation

## 2021-05-12 LAB — CBC WITH DIFFERENTIAL/PLATELET
Abs Immature Granulocytes: 0.01 10*3/uL (ref 0.00–0.07)
Basophils Absolute: 0 10*3/uL (ref 0.0–0.1)
Basophils Relative: 1 %
Eosinophils Absolute: 0.1 10*3/uL (ref 0.0–0.5)
Eosinophils Relative: 2 %
HCT: 37.7 % (ref 36.0–46.0)
Hemoglobin: 12.3 g/dL (ref 12.0–15.0)
Immature Granulocytes: 0 %
Lymphocytes Relative: 38 %
Lymphs Abs: 1.2 10*3/uL (ref 0.7–4.0)
MCH: 31.7 pg (ref 26.0–34.0)
MCHC: 32.6 g/dL (ref 30.0–36.0)
MCV: 97.2 fL (ref 80.0–100.0)
Monocytes Absolute: 0.5 10*3/uL (ref 0.1–1.0)
Monocytes Relative: 15 %
Neutro Abs: 1.4 10*3/uL — ABNORMAL LOW (ref 1.7–7.7)
Neutrophils Relative %: 44 %
Platelets: 288 10*3/uL (ref 150–400)
RBC: 3.88 MIL/uL (ref 3.87–5.11)
RDW: 13.3 % (ref 11.5–15.5)
WBC: 3.2 10*3/uL — ABNORMAL LOW (ref 4.0–10.5)
nRBC: 0 % (ref 0.0–0.2)

## 2021-05-12 LAB — IRON AND TIBC
Iron: 123 ug/dL (ref 28–170)
Saturation Ratios: 35 % — ABNORMAL HIGH (ref 10.4–31.8)
TIBC: 351 ug/dL (ref 250–450)
UIBC: 228 ug/dL

## 2021-05-12 LAB — FERRITIN: Ferritin: 17 ng/mL (ref 11–307)

## 2021-05-14 ENCOUNTER — Encounter: Payer: Self-pay | Admitting: Oncology

## 2021-05-14 ENCOUNTER — Inpatient Hospital Stay: Payer: Federal, State, Local not specified - PPO | Admitting: Oncology

## 2021-05-14 VITALS — BP 122/78 | HR 65 | Temp 96.6°F | Resp 16 | Ht 65.0 in | Wt 194.1 lb

## 2021-05-14 DIAGNOSIS — D708 Other neutropenia: Secondary | ICD-10-CM

## 2021-05-14 DIAGNOSIS — D649 Anemia, unspecified: Secondary | ICD-10-CM | POA: Diagnosis not present

## 2021-05-14 NOTE — Progress Notes (Signed)
?Hematology/Oncology Consult note ?[facility] ?[phone] [phone] ? ? ?Patient Care Team: ?Pcp, No as PCP - General ? ?REFERRING PROVIDER: ?No ref. provider found  ?CHIEF COMPLAINTS/REASON FOR VISIT:  ?Anemia ?HISTORY OF PRESENTING ILLNESS:  ? ?Cynthia Barajas is a  46 y.o.  female with PMH listed below was seen in consultation at the request of  No ref. provider found  for evaluation of anemia ? ?Patient moved to [place] recently from [place]. ?She reports a history of iron deficiency anemia.  She previously has received IV iron treatments.  She has relocated multiple times due to her work nature. ?Per patient, she started her IV Injectafer back in 2018, in [place]. ?She reports a history of allergic reactions including difficult breathing and skin itching after receiving INFeD in 2019. ?Previous hematology note by [address] [facility] Dr. Rennie Natter on 09/09/2020 was scanned to EMR.  Records were reviewed.  Patient received IV 25 mg of Benadryl prior to injectafer.  She has had nausea vomiting for 1 week after the treatments. ?Patient reports that it was her previous hematologist protocol that everybody receives management and she has asked to receive a lower dose of Benadryl. ?Iron deficiency was felt to be secondary to menorrhagia.  She follows up with the [place] [facility] and prefers to continue following up there. ?Patient reports that she had EGD/colonoscopy done in 2021.  ? ?She also reports having COVID19 Last year and continues to have chronic shortness of breath with exertion.  She is interested in finding a local pulmonologist for evaluation.  She has not had a local primary care provider yet. ? ?Thyroid nodule, 09/04/2020, thyroid ultrasound showed stable thyroid nodules since 03/11/2020.  Was recommended to follow-up in 1, 3, 5 years. ? ?INTERVAL HISTORY ?Cynthia Barajas is a 46 y.o. female who has above history reviewed by me today presents for  follow up visit for anemia ?Patient is going to moved to [place] due to job relocation. ?No new complaints. ? ?Review of Systems  ?Constitutional:  Negative for appetite change, chills, fatigue, fever and unexpected weight change.  ?HENT:   Negative for hearing loss and voice change.   ?Eyes:  Negative for eye problems.  ?Respiratory:  Negative for chest tightness and cough.   ?Cardiovascular:  Negative for chest pain.  ?Gastrointestinal:  Negative for abdominal distention, abdominal pain and blood in stool.  ?Endocrine: Negative for hot flashes.  ?Genitourinary:  Positive for menstrual problem. Negative for difficulty urinating and frequency.   ?Musculoskeletal:  Negative for arthralgias.  ?Skin:  Negative for itching and rash.  ?Neurological:  Negative for extremity weakness.  ?Hematological:  Negative for adenopathy.  ?Psychiatric/Behavioral:  Negative for confusion.   ? ?MEDICAL HISTORY:  ?Past Medical History:  ?Diagnosis Date  ? Anemia   ? Depression   ? Hx of colonoscopy   ? Thyroid nodule   ? ? ?SURGICAL HISTORY: ?Past Surgical History:  ?Procedure Laterality Date  ? COLONOSCOPY  2021  ? ? ?SOCIAL HISTORY: ?Social History  ? ?Socioeconomic History  ? Marital status: Single  ?  Spouse name: Not on file  ? Number of children: Not on file  ? Years of education: Not on file  ? Highest education level: Not on file  ?Occupational History  ? Not on file  ?Tobacco Use  ? Smoking status: Never  ? Smokeless tobacco: Never  ?Vaping Use  ? Vaping Use: Never used  ?Substance and Sexual Activity  ? Alcohol use: Never  ? Drug  use: Never  ? Sexual activity: Not on file  ?Other Topics Concern  ? Not on file  ?Social History Narrative  ? Not on file  ? ?Social Determinants of Health  ? ?Financial Resource Strain: Not on file  ?Food Insecurity: Not on file  ?Transportation Needs: Not on file  ?Physical Activity: Not on file  ?Stress: Not on file  ?Social Connections: Not on file  ?Intimate Partner Violence: Not on file   ? ? ?FAMILY HISTORY: ?Family History  ?Problem Relation Age of Onset  ? Hypertension Mother   ? ? ?ALLERGIES:  has No Known Allergies. ? ?MEDICATIONS:  ?Current Outpatient Medications  ?Medication Sig Dispense Refill  ? Ascorbic Acid (VITAMIN C) 100 MG tablet Take 100 mg by mouth daily.    ? Multiple Vitamins-Minerals (RA CENTRAL-VITE WOMENS MATURE PO) Take by mouth.    ? Omega-3 1000 MG CAPS Take by mouth.    ? valACYclovir (VALTREX) 500 MG tablet Take 500 mg by mouth daily. SMARTSIG:1 Tablet(s) By Mouth Daily    ? vitamin B-12 (CYANOCOBALAMIN) 100 MCG tablet Take 100 mcg by mouth daily.    ? vitamin E 1000 UNIT capsule Take by mouth daily.    ? Zinc Sulfate (ZINC 15 PO) Take by mouth.    ? ?No current facility-administered medications for this visit.  ? ? ? ?PHYSICAL EXAMINATION: ?ECOG PERFORMANCE STATUS: 0 - Asymptomatic ?Vitals:  ? 05/14/21 1010  ?BP: 122/78  ?Pulse: 65  ?Resp: 16  ?Temp: (!) 96.6 ?F (35.9 ?C)  ?SpO2: 99%  ? ?Filed Weights  ? 05/14/21 1010  ?Weight: 194 lb 1.6 oz (88 kg)  ? ? ?Physical Exam ?Constitutional:   ?   General: She is not in acute distress. ?HENT:  ?   Head: Normocephalic and atraumatic.  ?Eyes:  ?   General: No scleral icterus. ?Cardiovascular:  ?   Rate and Rhythm: Normal rate and regular rhythm.  ?   Heart sounds: Normal heart sounds.  ?Pulmonary:  ?   Effort: Pulmonary effort is normal. No respiratory distress.  ?   Breath sounds: No wheezing.  ?Abdominal:  ?   General: Bowel sounds are normal. There is no distension.  ?   Palpations: Abdomen is soft.  ?Musculoskeletal:     ?   General: No deformity. Normal range of motion.  ?   Cervical back: Normal range of motion and neck supple.  ?Skin: ?   General: Skin is warm and dry.  ?   Findings: No erythema or rash.  ?Neurological:  ?   Mental Status: She is alert and oriented to person, place, and time. Mental status is at baseline.  ?   Cranial Nerves: No cranial nerve deficit.  ?   Coordination: Coordination normal.  ?Psychiatric:      ?   Mood and Affect: Mood normal.  ? ? ?LABORATORY DATA:  ?I have reviewed the data as listed ?Lab Results  ?Component Value Date  ? WBC 3.2 (L) 05/12/2021  ? HGB 12.3 05/12/2021  ? HCT 37.7 05/12/2021  ? MCV 97.2 05/12/2021  ? PLT 288 05/12/2021  ? ?Recent Labs  ?  11/09/20 ?1013 01/21/21 ?0834 03/07/21 ?0935  ?NA 135 138 138  ?K 3.9 3.3* 4.0  ?CL 103 101 105  ?CO2 25 28 25   ?GLUCOSE 93 87 104*  ?BUN 12 7 10   ?CREATININE 0.57 0.61 0.54  ?CALCIUM 9.7 9.3 9.0  ?GFRNONAA >60 >60 >60  ?PROT 8.8* 7.6  --   ?ALBUMIN  4.8 4.1  --   ?AST 18 19  --   ?ALT 15 16  --   ?ALKPHOS 70 60  --   ?BILITOT 0.2* 0.5  --   ? ? ?Iron/TIBC/Ferritin/ %Sat ?   ?Component Value Date/Time  ? IRON 123 05/12/2021 1125  ? TIBC 351 05/12/2021 1125  ? FERRITIN 17 05/12/2021 1125  ? IRONPCTSAT 35 (H) 05/12/2021 1125  ? ?  ? ? ?RADIOGRAPHIC STUDIES: ?I have personally reviewed the radiological images as listed and agreed with the findings in the report. ?No results found. ? ? ? ?ASSESSMENT & PLAN:  ?1. Anemia, unspecified type   ?2. Other neutropenia (HCC)   ? ?history of iron deficiency anemia.  Menorrhagia ?Had blood work done after last visit.  Hemoglobin was normal.  Did not need IV Venofer treatments ?Labs reviewed and discussed with patient. ?Hemoglobin is normal.  No need for IV Venofer treatments. ? 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  Recommend observation. ? ?No further appointment will be scheduled at this point.  Patient is planning to relocate to another state.  I recommend patient to establish care with primary care provider there and if needed be referred to hematology. ? ?All questions were answered. The patient knows to call the clinic with any problems questions or concerns. ? ? ?Return of visit: 6 months ?Thank you for this kind referral and the opportunity to participate in the care of this patient. A copy of today's note is routed to referring provider  ? ? ?Rickard PatienceZhou Brealyn Baril, MD,  PhD ?Hematology Oncology ?[facility] ? ?05/14/2021 ? ?

## 2021-05-14 NOTE — Progress Notes (Signed)
Pt is Moving and last day here is May 28, 2021. Would like a referral when she finds a new provider in [place]. Pt debating on hysterectomy d/t uterine fibroids. ?

## 2023-04-03 IMAGING — CR DG CHEST 2V
1 series · 2 of 2 positions shown · non-contrast
Comparison: 12/28/2020

CLINICAL DATA: Chest pain

EXAM:
CHEST - 2 VIEW

[Series 1: dg chest 2 view · 0.14mm/px · 2 of 2 slices shown]
[im 1/2]
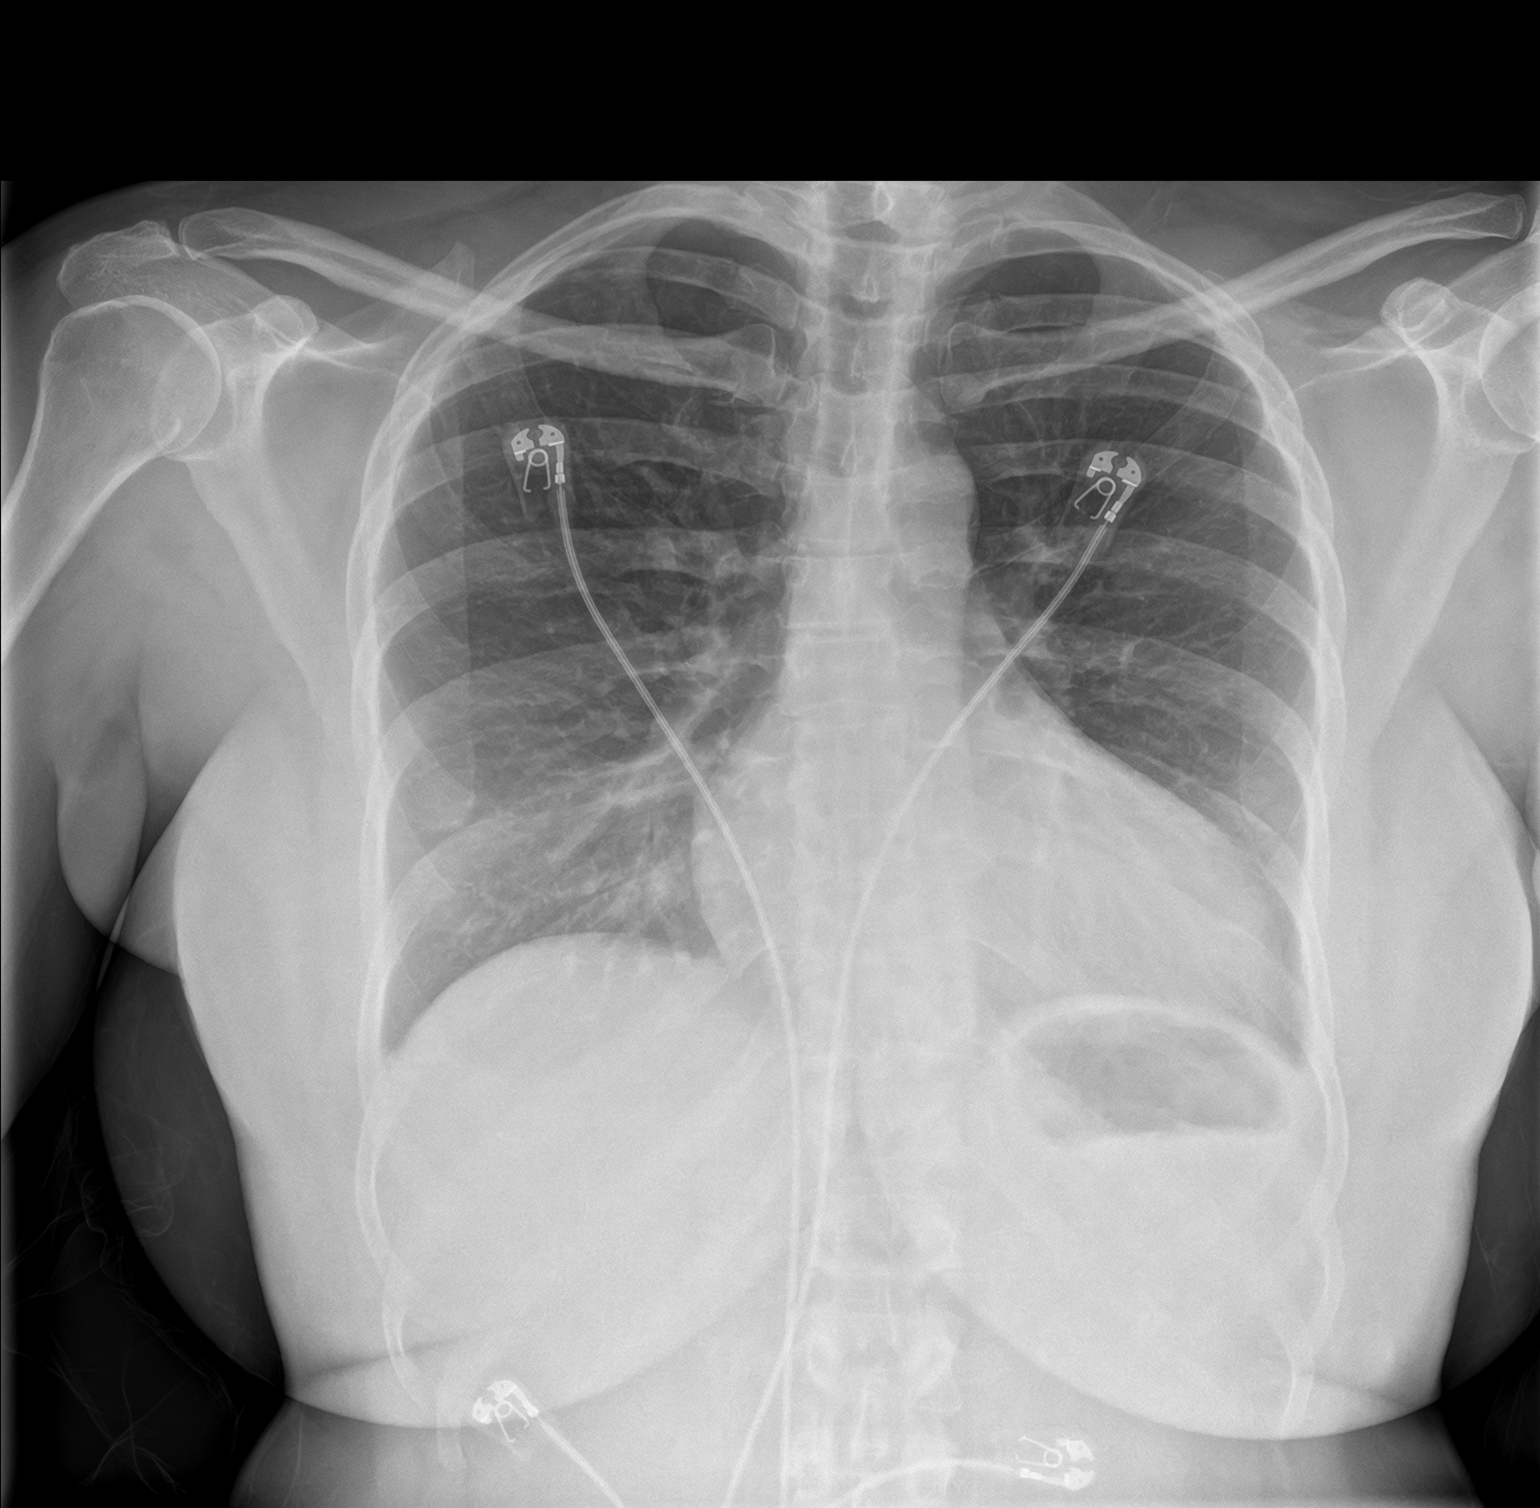
[im 2/2]
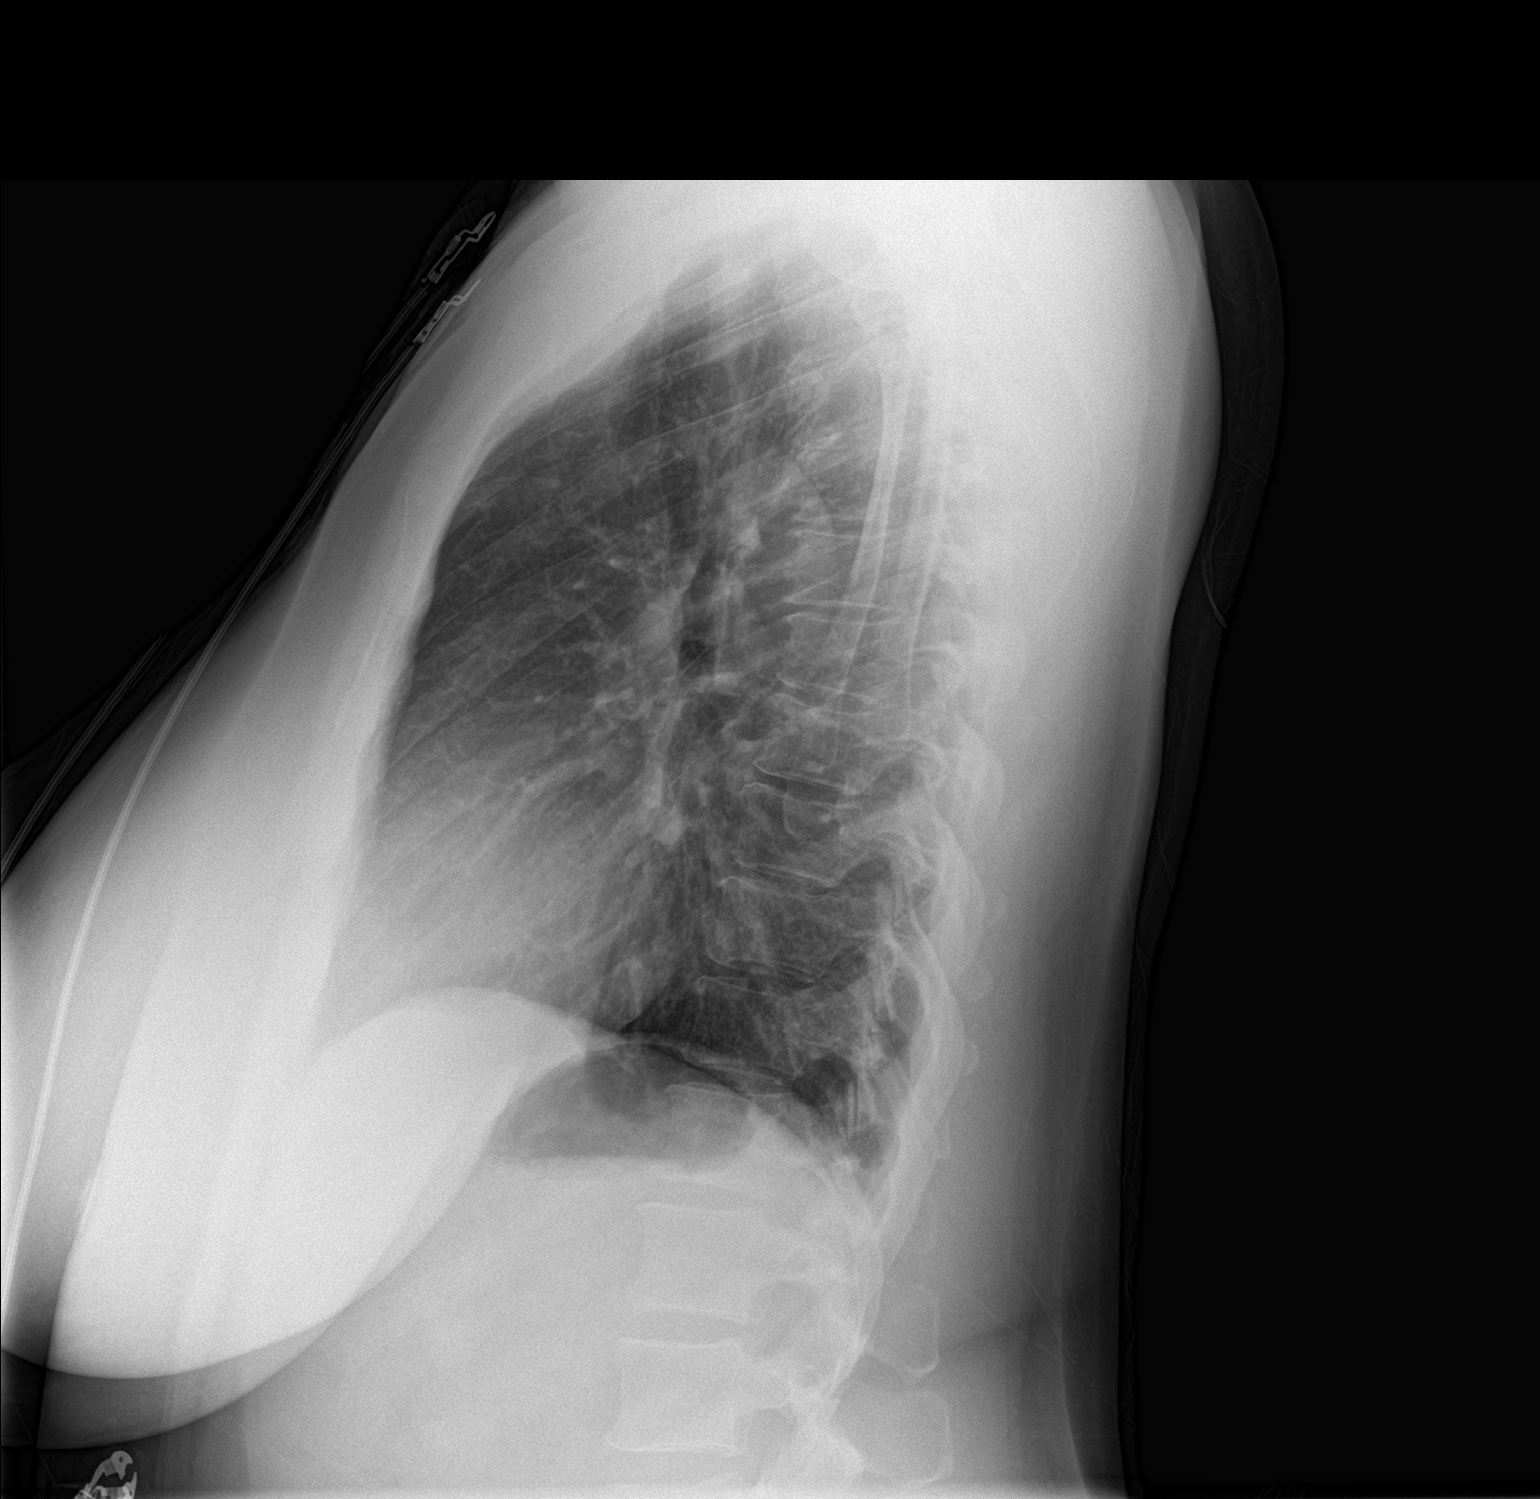

[2 of 2 positions shown; findings below may reference images not displayed]

FINDINGS: Mild cardiomegaly. Both lungs are clear. The visualized skeletal
structures are unremarkable.
IMPRESSION: Mild cardiomegaly without acute abnormality of the lungs.
